# Patient Record
Sex: Male | Born: 1995 | Race: Black or African American | Hispanic: No | Marital: Single
Health system: Southern US, Community
[De-identification: ages and names within clinical notes are randomized; demographics above are authoritative.]

---

## 2016-08-13 ENCOUNTER — Emergency Department (HOSPITAL_COMMUNITY): Admission: EM | Admit: 2016-08-13 | Discharge: 2016-08-13 | Discharge status: Against Medical Advice | Payer: Self-pay

## 2016-08-13 NOTE — ED Triage Notes (Signed)
No answer for triage x2 

## 2016-08-13 NOTE — ED Notes (Signed)
Pt called multiples for triage and no response

## 2016-08-13 NOTE — ED Notes (Signed)
Called for triage, no response.

## 2016-08-14 ENCOUNTER — Encounter (HOSPITAL_COMMUNITY): Payer: Self-pay | Admitting: Emergency Medicine

## 2016-08-14 ENCOUNTER — Emergency Department (HOSPITAL_COMMUNITY)
Admission: EM | Admit: 2016-08-14 | Discharge: 2016-08-14 | Disposition: A | Discharge status: Home / Self Care | Payer: Self-pay | Attending: Emergency Medicine | Admitting: Emergency Medicine

## 2016-08-14 ENCOUNTER — Emergency Department (HOSPITAL_COMMUNITY): Payer: Self-pay

## 2016-08-14 DIAGNOSIS — W231XXA Caught, crushed, jammed, or pinched between stationary objects, initial encounter: Secondary | ICD-10-CM | POA: Insufficient documentation

## 2016-08-14 DIAGNOSIS — Y9367 Activity, basketball: Secondary | ICD-10-CM | POA: Insufficient documentation

## 2016-08-14 DIAGNOSIS — Y999 Unspecified external cause status: Secondary | ICD-10-CM | POA: Insufficient documentation

## 2016-08-14 DIAGNOSIS — Y929 Unspecified place or not applicable: Secondary | ICD-10-CM | POA: Insufficient documentation

## 2016-08-14 DIAGNOSIS — S63259A Unspecified dislocation of unspecified finger, initial encounter: Secondary | ICD-10-CM

## 2016-08-14 DIAGNOSIS — S63281A Dislocation of proximal interphalangeal joint of left index finger, initial encounter: Secondary | ICD-10-CM | POA: Insufficient documentation

## 2016-08-14 MED ORDER — LIDOCAINE HCL (PF) 1 % IJ SOLN
10.0000 mL | Freq: Once | INTRAMUSCULAR | Status: AC
  Administered 2016-08-14: 10 mL
  Filled 2016-08-14: qty 10

## 2016-08-14 NOTE — ED Notes (Signed)
See EDP secondary assessment.  

## 2016-08-14 NOTE — ED Provider Notes (Signed)
[facility]-EMERGENCY DEPT Provider Note    By signing my name below, I, Earmon Phoenix, attest that this documentation has been prepared under the direction and in the presence of Langston Masker, [place]. Electronically Signed: Earmon Phoenix, ED Scribe. 08/14/16. 1:45 PM.    History   Chief Complaint Chief Complaint  Patient presents with  . Finger Injury    The history is provided by the patient and medical records. No language interpreter was used.    Patrick Murphy is a 21 y.o. male who presents to the Emergency Department complaining of an injury to the left index finger that occurred yesterday evening. He reports associated pain, bruising and swelling. He states he was playing basketball and jammed his finger on the ball and heard a pop. He has not taken anything for pain but has iced the area and applied a splint he purchased at [facility]. Resting the finger helps alleviate the pain. Moving the finger increases the pain. He denies previous injury to the finger or hand. He denies numbness, tingling or weakness of the left finger or hand, wounds.   History reviewed. No pertinent past medical history.  There are no active problems to display for this patient.   History reviewed. No pertinent surgical history.     Home Medications    Prior to Admission medications   Not on File    Family History No family history on file.  Social History Social History  Substance Use Topics  . Smoking status: Not on file  . Smokeless tobacco: Not on file  . Alcohol use Not on file     Allergies   Patient has no known allergies.   Review of Systems Review of Systems  Musculoskeletal: Positive for arthralgias.     Physical Exam Updated Vital Signs BP 126/84 (BP Location: Left Arm)   Pulse 60   Temp 98.5 F (36.9 C) (Oral)   Resp 17   SpO2 100%   Physical Exam  Constitutional: He is oriented to person, place, and time. He appears well-developed and well-nourished.  HENT:  Head:  Normocephalic and atraumatic.  Neck: Normal range of motion.  Cardiovascular: Normal rate.   Cap refill intact.  Pulmonary/Chest: Effort normal.  Musculoskeletal: He exhibits edema and tenderness.  PIP of left index finger with tenderness and swelling.  Neurological: He is alert and oriented to person, place, and time.  NVI and neurosensory intact.  Skin: Skin is warm and dry.  Psychiatric: He has a normal mood and affect. His behavior is normal.  Nursing note and vitals reviewed.    ED Treatments / Results  DIAGNOSTIC STUDIES: Oxygen Saturation is 100% on RA, normal by my interpretation.   COORDINATION OF CARE: 10:58 AM- Will order imaging. Pt verbalizes understanding and agrees to plan.  Medications  lidocaine (PF) (XYLOCAINE) 1 % injection 10 mL (10 mLs Other Given by Other 08/14/16 1147)    Labs (all labs ordered are listed, but only abnormal results are displayed) Labs Reviewed - No data to display  EKG  EKG Interpretation None       Radiology Dg Finger Index Left  Result Date: 08/14/2016 CLINICAL DATA:  S/P RE-LOCATION OF PIP JOINT LEFT INDEX FINGER EXAM: LEFT INDEX FINGER 2+V COMPARISON:  Exam earlier today FINDINGS: Status post reduction of proximal interphalangeal joint of the index finger. No acute fracture identified. There is mild soft tissue swelling of the proximal aspect of the digit. IMPRESSION: Status post reduction of the proximal interphalangeal joint. No acute fracture. Electronically  Signed   By: Norva PavlovElizabeth  Brown M.D.   On: 08/14/2016 12:37   Dg Finger Index Left  Result Date: 08/14/2016 CLINICAL DATA:  Injury while playing basketball EXAM: LEFT SECOND FINGER 2+V COMPARISON:  None. FINDINGS: Frontal, oblique, and lateral views obtained. There is dislocation at the second PIP joint. The second middle and distal phalanges are displaced dorsally with respect to proximal phalanx. There is 5 mm of overriding bony structures. No fracture. No other dislocation.  No appreciable joint space narrowing or erosion. IMPRESSION: Dislocation at the second PIP joint with the middle and distal phalanges displaced dorsal to the proximal phalanx. No fracture. No apparent arthropathy. Electronically Signed   By: Bretta BangWilliam  Woodruff III M.D.   On: 08/14/2016 11:23    Procedures Reduction of dislocation Date/Time: 08/14/2016 11:59 AM Performed by: Elson AreasSOFIA, LESLIE K Authorized by: Elson AreasSOFIA, LESLIE K  Consent: Verbal consent obtained. Risks and benefits: risks, benefits and alternatives were discussed Consent given by: patient Patient understanding: patient states understanding of the procedure being performed Patient consent: the patient's understanding of the procedure matches consent given Relevant documents: relevant documents present and verified Test results: test results available and properly labeled Imaging studies: imaging studies available Required items: required blood products, implants, devices, and special equipment available Patient identity confirmed: verbally with patient Preparation: Patient was prepped and draped in the usual sterile fashion. Local anesthesia used: yes Anesthesia: digital block  Anesthesia: Local anesthesia used: yes Local Anesthetic: lidocaine 1% without epinephrine  Sedation: Patient sedated: no Patient tolerance: Patient tolerated the procedure well with no immediate complications    (including critical care time)  Medications Ordered in ED Medications  lidocaine (PF) (XYLOCAINE) 1 % injection 10 mL (10 mLs Other Given by Other 08/14/16 1147)     Initial Impression / Assessment and Plan / ED Course  I have reviewed the triage vital signs and the nursing notes.  Pertinent labs & imaging results that were available during my care of the patient were reviewed by me and considered in my medical decision making (see chart for details).     Patient presents after a left index finger injury that occurred yesterday while  playing basketball. X-Ray showing dislocation at the second PIP joint with the middle and distal phalanges displaced dorsal to the proximal phalanx without fracture. Finger was reduced without incident and postreduction X-Ray shows proper placement. Pt advised to follow up with hand specialist. Patient given splint while in ED, conservative therapy recommended and discussed. Patient will be discharged home & is agreeable with above plan. Returns precautions discussed. Pt appears safe for discharge.   Final Clinical Impressions(s) / ED Diagnoses   Final diagnoses:  Dislocation of finger, initial encounter    New Prescriptions There are no discharge medications for this patient.   An After Visit Summary was printed and given to the patient.  I personally performed the services in this documentation, which was scribed in my presence.  The recorded information has been reviewed and considered.   Barnet PallKaren SofiaPAC.    Elson AreasSofia, Leslie K, PA-C 08/14/16 1546    Tilden Fossaees, Elizabeth, MD 08/15/16 [phone]

## 2016-08-14 NOTE — ED Notes (Signed)
Patient transported to X-ray 

## 2016-08-14 NOTE — ED Triage Notes (Signed)
Pt reports injuring his left index finger while playing basketball yesterday, states he heard a pop and cannot bend finger now, has splint in place from home.

## 2019-01-17 IMAGING — DX DG FINGER INDEX 2+V*L*
3 series · 3 of 3 positions shown · non-contrast
Comparison: None.

CLINICAL DATA: Injury while playing basketball

EXAM:
LEFT SECOND FINGER 2+V

[finger ap]
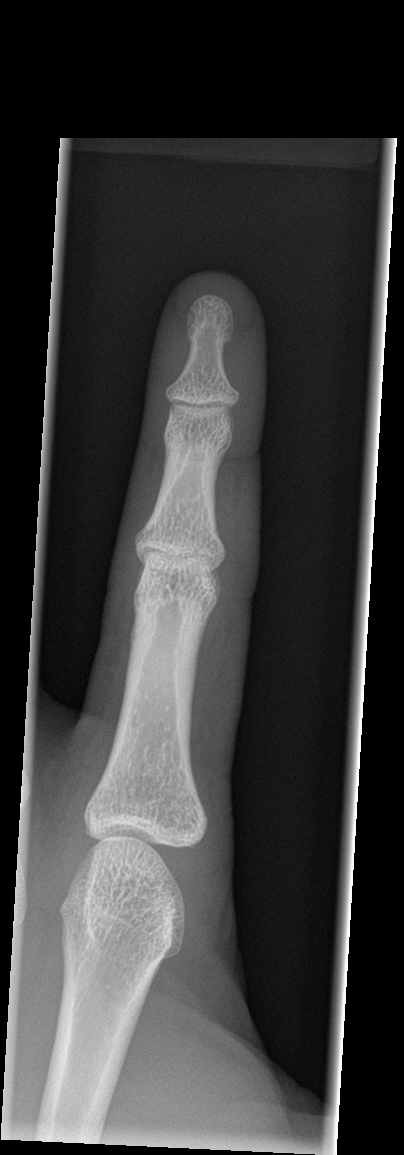

[finger obl]
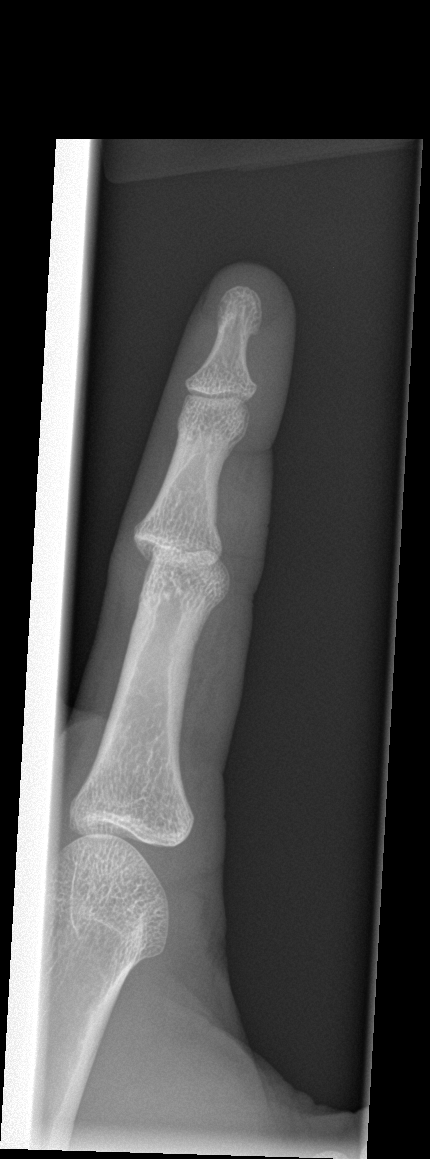

[finger lat]
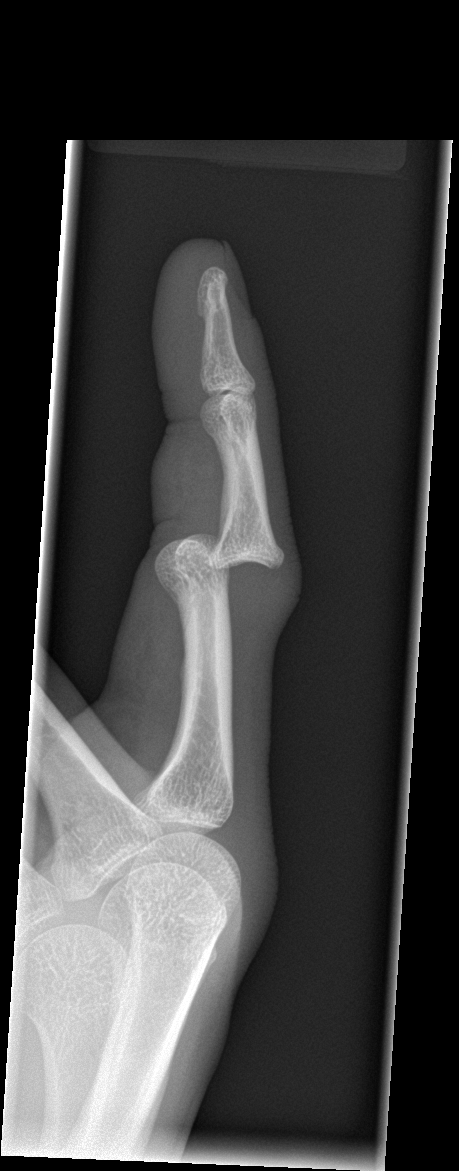

[3 of 3 positions shown; findings below may reference images not displayed]

FINDINGS: Frontal, oblique, and lateral views obtained. There is dislocation
at the second PIP joint. The second middle and distal phalanges are
displaced dorsally with respect to proximal phalanx. There is 5 mm
of overriding bony structures. No fracture. No other dislocation. No
appreciable joint space narrowing or erosion.
IMPRESSION: Dislocation at the second PIP joint with the middle and distal
phalanges displaced dorsal to the proximal phalanx. No fracture. No
apparent arthropathy.
